# Patient Record
Sex: Female | Born: 1975 | Race: White | Hispanic: No | Marital: Single | Smoking: Current some day smoker
Health system: Southern US, Community
[De-identification: ages and names within clinical notes are randomized; demographics above are authoritative.]

## PROBLEM LIST (undated history)

## (undated) DIAGNOSIS — M519 Unspecified thoracic, thoracolumbar and lumbosacral intervertebral disc disorder: Secondary | ICD-10-CM

---

## 1997-09-17 ENCOUNTER — Emergency Department (HOSPITAL_COMMUNITY): Admission: EM | Admit: 1997-09-17 | Discharge: 1997-09-17 | Payer: Self-pay

## 1998-04-22 ENCOUNTER — Encounter: Payer: Self-pay | Admitting: Emergency Medicine

## 1998-04-22 ENCOUNTER — Emergency Department (HOSPITAL_COMMUNITY): Admission: EM | Admit: 1998-04-22 | Discharge: 1998-04-22 | Payer: Self-pay | Admitting: Emergency Medicine

## 2001-10-20 ENCOUNTER — Ambulatory Visit (HOSPITAL_BASED_OUTPATIENT_CLINIC_OR_DEPARTMENT_OTHER): Admission: RE | Admit: 2001-10-20 | Discharge: 2001-10-20 | Payer: Self-pay | Admitting: Orthopedic Surgery

## 2001-10-22 ENCOUNTER — Emergency Department (HOSPITAL_COMMUNITY): Admission: EM | Admit: 2001-10-22 | Discharge: 2001-10-22 | Payer: Self-pay | Admitting: Emergency Medicine

## 2001-10-22 ENCOUNTER — Encounter: Payer: Self-pay | Admitting: Emergency Medicine

## 2008-03-18 ENCOUNTER — Ambulatory Visit (HOSPITAL_BASED_OUTPATIENT_CLINIC_OR_DEPARTMENT_OTHER): Admission: RE | Admit: 2008-03-18 | Discharge: 2008-03-18 | Payer: Self-pay | Admitting: Family Medicine

## 2008-03-18 ENCOUNTER — Ambulatory Visit: Payer: Self-pay | Admitting: Diagnostic Radiology

## 2008-06-08 ENCOUNTER — Ambulatory Visit: Payer: Self-pay | Admitting: Diagnostic Radiology

## 2008-06-08 ENCOUNTER — Ambulatory Visit (HOSPITAL_BASED_OUTPATIENT_CLINIC_OR_DEPARTMENT_OTHER): Admission: RE | Admit: 2008-06-08 | Discharge: 2008-06-08 | Payer: Self-pay | Admitting: Family Medicine

## 2010-05-25 ENCOUNTER — Emergency Department (HOSPITAL_COMMUNITY): Payer: 59

## 2010-05-25 ENCOUNTER — Emergency Department (HOSPITAL_COMMUNITY)
Admission: EM | Admit: 2010-05-25 | Discharge: 2010-05-26 | Disposition: A | Discharge status: Other Acute Inpatient Hospital | Payer: 59 | Attending: Emergency Medicine | Admitting: Emergency Medicine

## 2010-05-25 DIAGNOSIS — F29 Unspecified psychosis not due to a substance or known physiological condition: Secondary | ICD-10-CM | POA: Insufficient documentation

## 2010-05-25 DIAGNOSIS — T50902A Poisoning by unspecified drugs, medicaments and biological substances, intentional self-harm, initial encounter: Secondary | ICD-10-CM | POA: Insufficient documentation

## 2010-05-25 DIAGNOSIS — T50901A Poisoning by unspecified drugs, medicaments and biological substances, accidental (unintentional), initial encounter: Secondary | ICD-10-CM | POA: Insufficient documentation

## 2010-05-25 DIAGNOSIS — R4182 Altered mental status, unspecified: Secondary | ICD-10-CM | POA: Insufficient documentation

## 2010-05-25 LAB — DIFFERENTIAL
Basophils Relative: 0 % (ref 0–1)
Lymphocytes Relative: 17 % (ref 12–46)
Lymphs Abs: 1.3 10*3/uL (ref 0.7–4.0)
Monocytes Absolute: 0.6 10*3/uL (ref 0.1–1.0)
Monocytes Relative: 8 % (ref 3–12)
Neutro Abs: 5.7 10*3/uL (ref 1.7–7.7)
Neutrophils Relative %: 75 % (ref 43–77)

## 2010-05-25 LAB — URINALYSIS, ROUTINE W REFLEX MICROSCOPIC
Bilirubin Urine: NEGATIVE
Hgb urine dipstick: NEGATIVE
Ketones, ur: NEGATIVE mg/dL
Specific Gravity, Urine: 1.013 (ref 1.005–1.030)
pH: 8 (ref 5.0–8.0)

## 2010-05-25 LAB — COMPREHENSIVE METABOLIC PANEL
ALT: 13 U/L (ref 0–35)
Albumin: 3.9 g/dL (ref 3.5–5.2)
Alkaline Phosphatase: 44 U/L (ref 39–117)
Chloride: 103 mEq/L (ref 96–112)
Glucose, Bld: 104 mg/dL — ABNORMAL HIGH (ref 70–99)
Potassium: 3.6 mEq/L (ref 3.5–5.1)
Sodium: 140 mEq/L (ref 135–145)
Total Bilirubin: 0.3 mg/dL (ref 0.3–1.2)
Total Protein: 6.3 g/dL (ref 6.0–8.3)

## 2010-05-25 LAB — CBC
HCT: 36.9 % (ref 36.0–46.0)
Hemoglobin: 12.2 g/dL (ref 12.0–15.0)
MCH: 28.9 pg (ref 26.0–34.0)
MCHC: 33.1 g/dL (ref 30.0–36.0)
RBC: 4.22 MIL/uL (ref 3.87–5.11)

## 2010-05-25 LAB — ETHANOL: Alcohol, Ethyl (B): <11 mg/dL — ABNORMAL HIGH (ref 0–10)

## 2010-05-25 LAB — RAPID URINE DRUG SCREEN, HOSP PERFORMED
Amphetamines: NOT DETECTED
Cocaine: NOT DETECTED
Opiates: NOT DETECTED
Tetrahydrocannabinol: NOT DETECTED

## 2010-05-25 LAB — POCT PREGNANCY, URINE: Preg Test, Ur: NEGATIVE

## 2010-05-26 ENCOUNTER — Inpatient Hospital Stay (HOSPITAL_COMMUNITY)
Admission: AD | Admit: 2010-05-26 | Discharge: 2010-05-27 | DRG: 885 | Disposition: A | Discharge status: Home / Self Care | Payer: 59 | Attending: Psychiatry | Admitting: Psychiatry

## 2010-05-26 DIAGNOSIS — F4320 Adjustment disorder, unspecified: Secondary | ICD-10-CM

## 2010-05-26 DIAGNOSIS — X838XXA Intentional self-harm by other specified means, initial encounter: Secondary | ICD-10-CM

## 2010-05-26 DIAGNOSIS — T481X4A Poisoning by skeletal muscle relaxants [neuromuscular blocking agents], undetermined, initial encounter: Secondary | ICD-10-CM

## 2010-05-26 DIAGNOSIS — F329 Major depressive disorder, single episode, unspecified: Principal | ICD-10-CM

## 2010-05-26 DIAGNOSIS — F319 Bipolar disorder, unspecified: Secondary | ICD-10-CM

## 2010-05-26 DIAGNOSIS — T424X4A Poisoning by benzodiazepines, undetermined, initial encounter: Secondary | ICD-10-CM

## 2010-05-26 DIAGNOSIS — T43502A Poisoning by unspecified antipsychotics and neuroleptics, intentional self-harm, initial encounter: Secondary | ICD-10-CM

## 2010-05-26 DIAGNOSIS — F0630 Mood disorder due to known physiological condition, unspecified: Secondary | ICD-10-CM

## 2010-05-26 DIAGNOSIS — S61509A Unspecified open wound of unspecified wrist, initial encounter: Secondary | ICD-10-CM

## 2010-05-26 DIAGNOSIS — T50992A Poisoning by other drugs, medicaments and biological substances, intentional self-harm, initial encounter: Secondary | ICD-10-CM

## 2010-05-27 DIAGNOSIS — F329 Major depressive disorder, single episode, unspecified: Secondary | ICD-10-CM

## 2010-05-27 NOTE — Consult Note (Signed)
Shelby Webb, Shelby Webb                ACCOUNT NO.:  [id number]  MEDICAL RECORD NO.:  [id number]           PATIENT TYPE:  E  LOCATION:  WLED                         FACILITY:  [facility]  PHYSICIAN:  Mylinh Cragg T. Sharonda Llamas, M.D.   DATE OF BIRTH:  05/25/1975  DATE OF CONSULTATION: DATE OF DISCHARGE:                                CONSULTATION   HISTORY OF PRESENT ILLNESS:  The patient is a 35 year old Caucasian female who is admitted to the [facility] ED after the patient attempted to kill herself by overdose on 30 Xanax and Flexeril.  The patient told that she had an argument with her partner who actually left her and seeking some other person.  The patient admitted that her relationship is getting worse in past 6 months and they have actually stopped couple therapy but did not new that he is already seeing someone.  The patient feels that she was cheated and started getting more depressed and having suicidal thoughts.  She actually cut her both arms with razorblade superficially and took the overdose on 30 Xanax.  The patient also admitted using cocaine on occasion but on a regular basis.  The patient denies any homicidal thoughts, hallucinations or paranoia.  PAST PSYCHIATRIC HISTORY:  The patient has at least 2 psychiatric admissions in the past, one in [facility] when she was diagnosed with bipolar and then [facility] for overnight.  The patient has been taken in the past Wellbutrin but did not like.  She has also seen one time by Dr. Lafayette Dragon  who diagnosed her bipolar and given Depakote but she did not took it.  She has been getting Effexor and Xanax from her primary care doctor, Dr. Tyrell Antonio.  She is also scheduled to see a therapist in [place] which has not begun.  PSYCHOSOCIAL HISTORY:  The patient lives with her 3 cats and 3 dogs. The patient has limited contact with her parents though she does talk to her mother  once a month on the phone.  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  ALCOHOL AND SUBSTANCE ABUSE HISTORY:  The patient admitted history of using cocaine on and off and beer on and off but she claimed that she is adult and she can drink alcohol.  MEDICAL HISTORY:  The patient has bulging disk.  She takes Flexeril 10 mg prescribed by Dr. Tyrell Antonio.  MENTAL STATUS EXAMINATION:  The patient is mildly obese female who is in hospital PJs.  She is irritable and maintained poor eye contact.  Her speech is at times fast and rapid.  Her thought process is also at times circumstantial.  There are superficial marks on both arms which could be due to cutting from razorblade.  She continued to endorse suicidal thinking "I just wanted to be dead" but she denies any homicidal thoughts, paranoia or any hallucinations.  Her attention and concentration were distracted at times.  There was no psychosis present. She is alert and oriented x3.  Her insight, judgment, impulse control were poor.  DIAGNOSIS:  AXIS I:  Mood disorder, not otherwise specified; rule  out bipolar disorder, depressed type; rule out major depressive disorder, severe. AXIS II:  Deferred. AXIS III:  See medical history. AXIS IV:  Moderate. AXIS V:  30  PLAN:  I talked with the patient about inpatient stabilization.  The patient at this time agreed to come for stabilization in [facility].  The patient does need stabilization and inpatient treatment. Once medically cleared, the patient can be transferred to [facility].     Thelmer Legler T. Lolly Mustache, M.D.     STA/MEDQ  D:  05/26/2010  T:  05/26/2010  Job:  [id number]  Electronically Signed by Kathryne Sharper M.D. on 05/27/2010 10:41:23 PM

## 2010-06-01 NOTE — Op Note (Signed)
Shelby Webb, Shelby Webb                          ACCOUNT NO.:  [id number]   MEDICAL RECORD NO.:  [id number]                   PATIENT TYPE:  AMB   LOCATION:  DSC                                  FACILITY:  [facility]   PHYSICIAN:  Katy Fitch. Naaman Plummer., M.D.          DATE OF BIRTH:  [birth year]   DATE OF PROCEDURE:  10/20/2001  DATE OF DISCHARGE:                                 OPERATIVE REPORT   PREOPERATIVE DIAGNOSIS:  Painful mass, dorsal aspect of left wrist  consistent with dorsal subretinacular ganglion.   POSTOPERATIVE DIAGNOSIS:  Painful mass, dorsal aspect of left wrist  consistent with dorsal subretinacular ganglion.   OPERATION:  Excision of left dorsal ganglion.   SURGEON:  Katy Fitch. Sypher, M.D.   ASSISTANT:  Jonni Sanger, P.A.   ANESTHESIA:  IV regional   SUPERVISING ANESTHESIOLOGIST:  Charles E. Gelene Mink, M.D.   INDICATIONS FOR PROCEDURE:  The patient is a 35 year old woman who presented  for evaluation and management of a painful mass on the dorsal aspect of her  left wrist.  Clinical examination reveals signs of a subretinacular dorsal  ganglion between the third and fourth dorsal compartment.  Due to the  failure to response to nonoperative measures, she is brought to the  operating room at this time for excision of her dorsal ganglion mass.   DESCRIPTION OF PROCEDURE:  The patient is brought to the operating room and  placed in the supine position on the operating room table.  Following  induction of general anesthesia, the left arm was prepped with Betadine soap  and solution, sterilely draped.  Following exsanguination of the left arm  with an Esmarch bandage, an arterial tourniquet on the proximal brachium was  inflated to 210 mmHg.  Procedure commenced with a transverse incision  directly over the mass.  Subcutaneous tissues were carefully divided  revealing the extensor retinaculum.  This was split in the line of its  fibers and retracted proximally  and distally.  The tendons of the second,  third and fourth dorsal compartments were identified and the mass palpated.  The capsule was spread over the mass and a 1 cm diameter ganglion type  lesion was identified directly over the scaphoid lunate ligament.  This was  carefully excised from the dorsal aspect of the scaphoid lunate ligament.  The radiocarpal ligament joint was inspected and found to be without signs  of other pathology.  Bleeding points were electrocauterized bipolar current,  followed by repair of the capsule anatomically with figure-of-eight sutures  of 4-0 Vicryl followed by repair of the retinaculum with 4-0 Vicryl and  repair of the skin with intradermal 3-0 Prolene.  There were no apparent  complications.   Compressive dressing was applied with volar plaster splint in 5 degrees of  dorsiflexion.  Katy Fitch Naaman Plummer., M.D.     RVS/MEDQ  D:  10/20/2001  T:  10/20/2001  Job:  [id number]
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
## 2012-11-22 ENCOUNTER — Emergency Department (HOSPITAL_BASED_OUTPATIENT_CLINIC_OR_DEPARTMENT_OTHER): Payer: 59

## 2012-11-22 ENCOUNTER — Encounter (HOSPITAL_BASED_OUTPATIENT_CLINIC_OR_DEPARTMENT_OTHER): Payer: Self-pay | Admitting: Emergency Medicine

## 2012-11-22 ENCOUNTER — Emergency Department (HOSPITAL_BASED_OUTPATIENT_CLINIC_OR_DEPARTMENT_OTHER)
Admission: EM | Admit: 2012-11-22 | Discharge: 2012-11-22 | Disposition: A | Discharge status: Home / Self Care | Payer: 59 | Attending: Emergency Medicine | Admitting: Emergency Medicine

## 2012-11-22 DIAGNOSIS — Y9389 Activity, other specified: Secondary | ICD-10-CM | POA: Insufficient documentation

## 2012-11-22 DIAGNOSIS — F17200 Nicotine dependence, unspecified, uncomplicated: Secondary | ICD-10-CM | POA: Insufficient documentation

## 2012-11-22 DIAGNOSIS — IMO0002 Reserved for concepts with insufficient information to code with codable children: Secondary | ICD-10-CM | POA: Diagnosis not present

## 2012-11-22 DIAGNOSIS — S0990XA Unspecified injury of head, initial encounter: Secondary | ICD-10-CM | POA: Diagnosis not present

## 2012-11-22 DIAGNOSIS — Y92410 Unspecified street and highway as the place of occurrence of the external cause: Secondary | ICD-10-CM | POA: Diagnosis not present

## 2012-11-22 DIAGNOSIS — R209 Unspecified disturbances of skin sensation: Secondary | ICD-10-CM | POA: Diagnosis not present

## 2012-11-22 DIAGNOSIS — Z8739 Personal history of other diseases of the musculoskeletal system and connective tissue: Secondary | ICD-10-CM | POA: Diagnosis not present

## 2012-11-22 DIAGNOSIS — S161XXA Strain of muscle, fascia and tendon at neck level, initial encounter: Secondary | ICD-10-CM

## 2012-11-22 DIAGNOSIS — H9319 Tinnitus, unspecified ear: Secondary | ICD-10-CM | POA: Insufficient documentation

## 2012-11-22 DIAGNOSIS — R42 Dizziness and giddiness: Secondary | ICD-10-CM | POA: Insufficient documentation

## 2012-11-22 DIAGNOSIS — S139XXA Sprain of joints and ligaments of unspecified parts of neck, initial encounter: Secondary | ICD-10-CM | POA: Insufficient documentation

## 2012-11-22 DIAGNOSIS — S0993XA Unspecified injury of face, initial encounter: Secondary | ICD-10-CM | POA: Diagnosis present

## 2012-11-22 HISTORY — DX: Unspecified thoracic, thoracolumbar and lumbosacral intervertebral disc disorder: M51.9

## 2012-11-22 MED ORDER — CYCLOBENZAPRINE HCL 10 MG PO TABS: 10.0000 mg | ORAL_TABLET | Freq: Two times a day (BID) | ORAL | Status: AC | PRN

## 2012-11-22 MED ORDER — NAPROXEN 500 MG PO TABS: 500.0000 mg | ORAL_TABLET | Freq: Two times a day (BID) | ORAL | Status: AC

## 2012-11-22 NOTE — ED Notes (Signed)
MD at bedside. 
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
## 2019-06-11 ENCOUNTER — Other Ambulatory Visit: Payer: Self-pay | Admitting: Family Medicine

## 2019-06-11 DIAGNOSIS — Z1231 Encounter for screening mammogram for malignant neoplasm of breast: Secondary | ICD-10-CM

## 2019-08-03 ENCOUNTER — Ambulatory Visit: Payer: Self-pay

## 2019-08-24 ENCOUNTER — Emergency Department (HOSPITAL_COMMUNITY)
Admission: EM | Admit: 2019-08-24 | Discharge: 2019-08-25 | Disposition: A | Discharge status: Home / Self Care | Payer: BLUE CROSS/BLUE SHIELD | Attending: Emergency Medicine | Admitting: Emergency Medicine

## 2019-08-24 ENCOUNTER — Encounter (HOSPITAL_COMMUNITY): Payer: Self-pay | Admitting: Pharmacy Technician

## 2019-08-24 ENCOUNTER — Other Ambulatory Visit: Payer: Self-pay

## 2019-08-24 ENCOUNTER — Emergency Department (HOSPITAL_COMMUNITY): Payer: BLUE CROSS/BLUE SHIELD

## 2019-08-24 DIAGNOSIS — H532 Diplopia: Secondary | ICD-10-CM | POA: Diagnosis not present

## 2019-08-24 DIAGNOSIS — Z5321 Procedure and treatment not carried out due to patient leaving prior to being seen by health care provider: Secondary | ICD-10-CM | POA: Diagnosis not present

## 2019-08-24 DIAGNOSIS — R202 Paresthesia of skin: Secondary | ICD-10-CM | POA: Diagnosis not present

## 2019-08-24 LAB — I-STAT CHEM 8, ED
BUN: 12 mg/dL (ref 6–20)
Calcium, Ion: 1.16 mmol/L (ref 1.15–1.40)
Chloride: 102 mmol/L (ref 98–111)
Creatinine, Ser: 1 mg/dL (ref 0.44–1.00)
Glucose, Bld: 144 mg/dL — ABNORMAL HIGH (ref 70–99)
HCT: 43 % (ref 36.0–46.0)
Hemoglobin: 14.6 g/dL (ref 12.0–15.0)
Potassium: 4.1 mmol/L (ref 3.5–5.1)
Sodium: 139 mmol/L (ref 135–145)
TCO2: 24 mmol/L (ref 22–32)

## 2019-08-24 LAB — COMPREHENSIVE METABOLIC PANEL
ALT: 21 U/L (ref 0–44)
AST: 24 U/L (ref 15–41)
Albumin: 4.9 g/dL (ref 3.5–5.0)
Alkaline Phosphatase: 65 U/L (ref 38–126)
Anion gap: 14 (ref 5–15)
BUN: 10 mg/dL (ref 6–20)
CO2: 22 mmol/L (ref 22–32)
Calcium: 9.8 mg/dL (ref 8.9–10.3)
Chloride: 101 mmol/L (ref 98–111)
Creatinine, Ser: 1.04 mg/dL — ABNORMAL HIGH (ref 0.44–1.00)
GFR calc Af Amer: >60 mL/min (ref >60)
GFR calc non Af Amer: >60 mL/min (ref >60)
Glucose, Bld: 141 mg/dL — ABNORMAL HIGH (ref 70–99)
Potassium: 4 mmol/L (ref 3.5–5.1)
Sodium: 137 mmol/L (ref 135–145)
Total Bilirubin: 0.3 mg/dL (ref 0.3–1.2)
Total Protein: 8 g/dL (ref 6.5–8.1)

## 2019-08-24 LAB — DIFFERENTIAL
Abs Immature Granulocytes: 0.03 10*3/uL (ref 0.00–0.07)
Basophils Absolute: 0.1 10*3/uL (ref 0.0–0.1)
Basophils Relative: 1 %
Eosinophils Absolute: 0.2 10*3/uL (ref 0.0–0.5)
Eosinophils Relative: 2 %
Immature Granulocytes: 0 %
Lymphocytes Relative: 29 %
Lymphs Abs: 2.2 10*3/uL (ref 0.7–4.0)
Monocytes Absolute: 0.6 10*3/uL (ref 0.1–1.0)
Monocytes Relative: 8 %
Neutro Abs: 4.6 10*3/uL (ref 1.7–7.7)
Neutrophils Relative %: 60 %

## 2019-08-24 LAB — CBC
HCT: 43.5 % (ref 36.0–46.0)
Hemoglobin: 13.9 g/dL (ref 12.0–15.0)
MCH: 29.4 pg (ref 26.0–34.0)
MCHC: 32 g/dL (ref 30.0–36.0)
MCV: 92.2 fL (ref 80.0–100.0)
Platelets: 364 10*3/uL (ref 150–400)
RBC: 4.72 MIL/uL (ref 3.87–5.11)
RDW: 12.6 % (ref 11.5–15.5)
WBC: 7.7 10*3/uL (ref 4.0–10.5)
nRBC: 0 % (ref 0.0–0.2)

## 2019-08-24 LAB — APTT: aPTT: 29 seconds (ref 24–36)

## 2019-08-24 LAB — PROTIME-INR
INR: 1 (ref 0.8–1.2)
Prothrombin Time: 12.3 seconds (ref 11.4–15.2)

## 2019-08-24 LAB — I-STAT BETA HCG BLOOD, ED (MC, WL, AP ONLY): I-stat hCG, quantitative: <5 m[IU]/mL (ref <5)

## 2019-08-24 MED ORDER — SODIUM CHLORIDE 0.9% FLUSH: 3.0000 mL | Freq: Once | INTRAVENOUS | Status: DC

## 2019-08-24 NOTE — ED Triage Notes (Signed)
Pt here with reports of tingling to hands and feet, double vision and trouble with balance for the last several days. Went to PCP and sent here for stroke eval.

## 2019-08-25 NOTE — ED Notes (Signed)
Pt decided to leave AMA. Pt was advised to stay to see the doctor. She stated that she was going to call medical records in the morning to check on the result of her labs.

## 2020-07-12 ENCOUNTER — Other Ambulatory Visit: Payer: Self-pay | Admitting: Family Medicine

## 2022-01-18 IMAGING — CT CT HEAD W/O CM
4 series · 16 of 47 positions shown, 18 images · non-contrast
Comparison: 11/22/2012

CLINICAL DATA: Paresthesias, neuro deficit

EXAM:
CT HEAD WITHOUT CONTRAST
TECHNIQUE: Contiguous axial images were obtained from the base of the skull
through the vertex without intravenous contrast.

[Series 3: head without · axial · non-contrast · 0.43mm/px · z∈[-71,+39]mm · 7 of 30 slices shown, 9 images]
[im 4/30  brain]
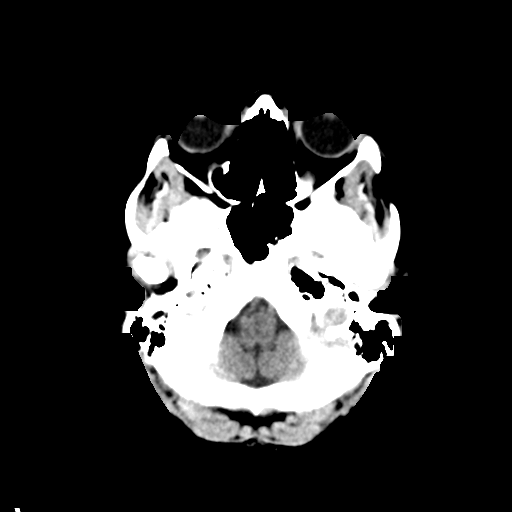
[im 4/30  bone]
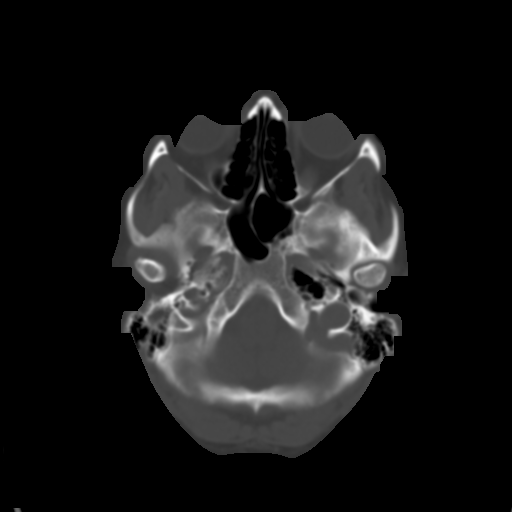
[im 8/30  brain]
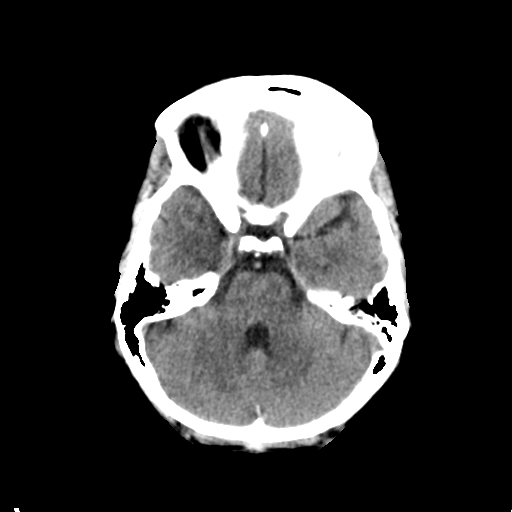
[im 11/30  brain]
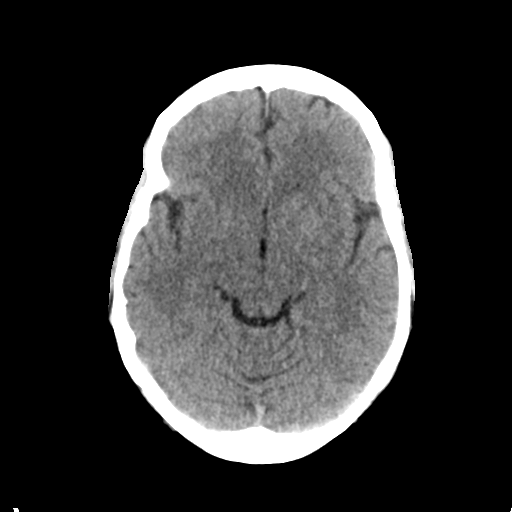
[im 15/30  brain]
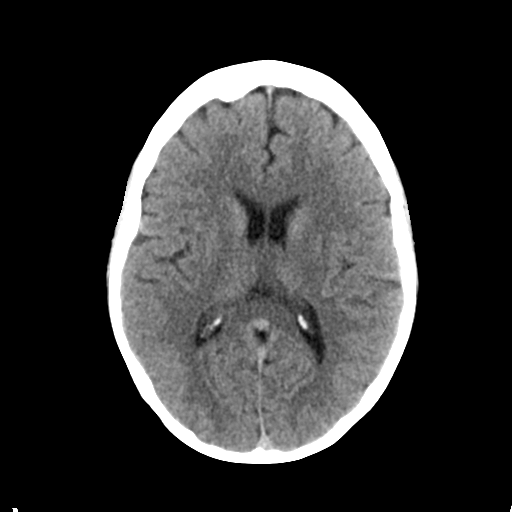
[im 19/30  brain]
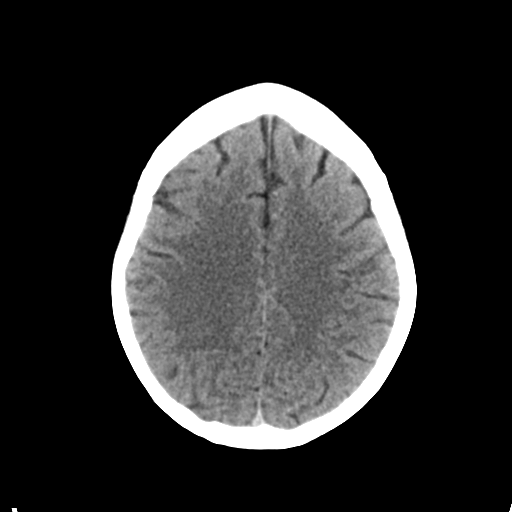
[im 19/30  bone]
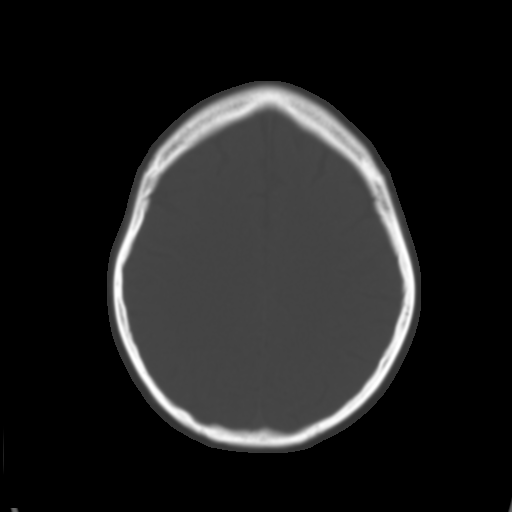
[im 22/30  brain]
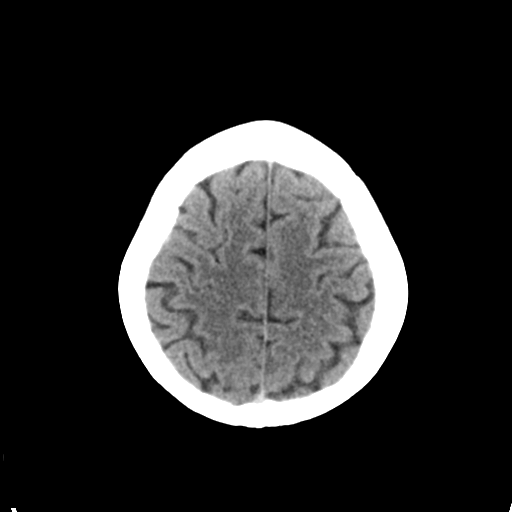
[im 26/30  brain]
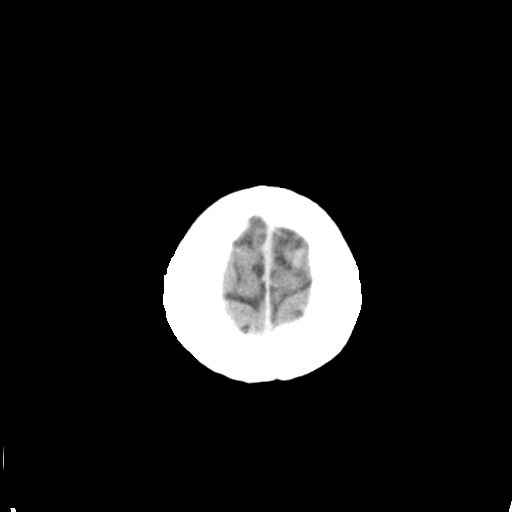

[Series 4: head bone · axial · 0.43mm/px · z∈[-72,-44]mm · 3 of 74 slices shown]
[im 8/74  bone]
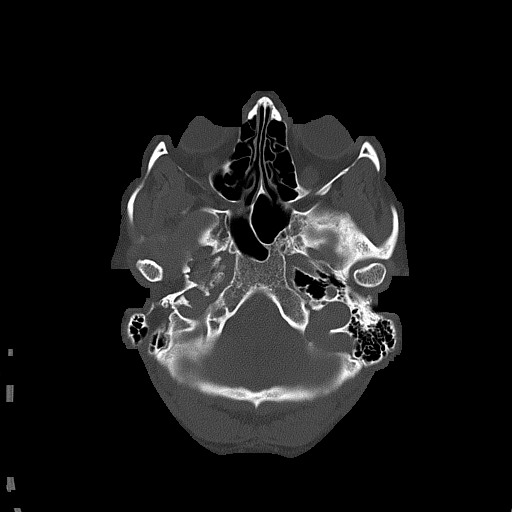
[im 15/74  bone]
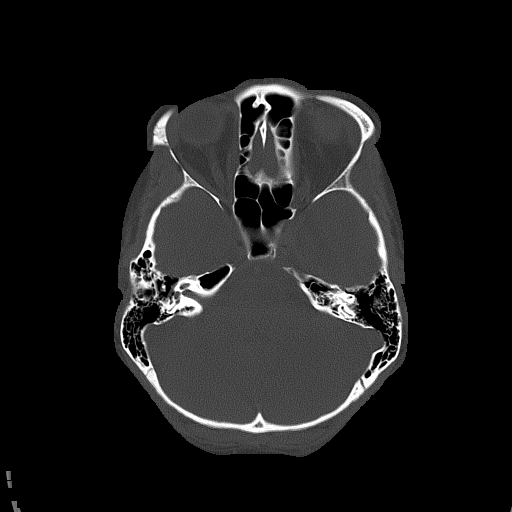
[im 22/74  bone]
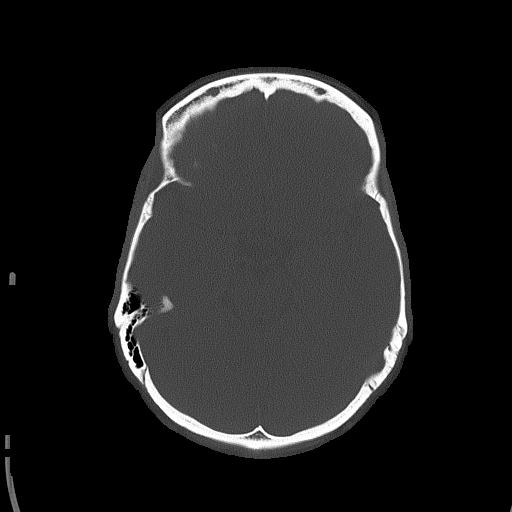

[Series 5: head without cor · coronal · non-contrast · 0.33mm/px · 3 of 67 slices shown]
[im 23/67  brain]
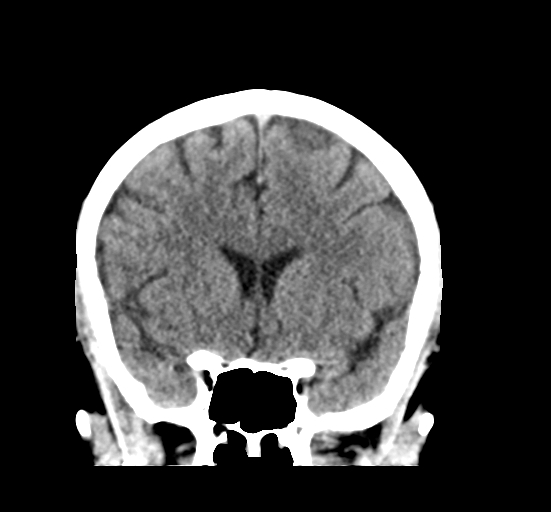
[im 30/67  brain]
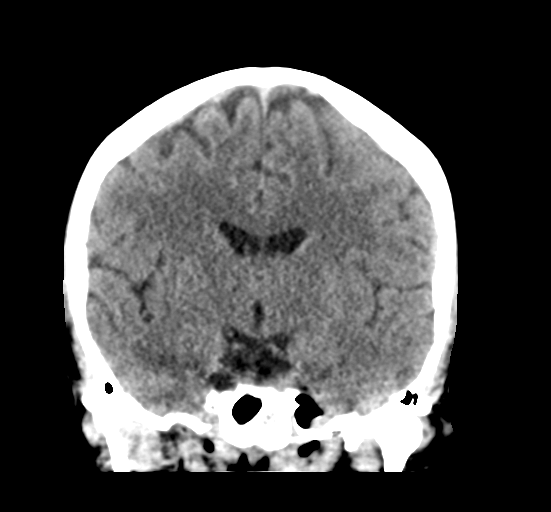
[im 37/67  brain]
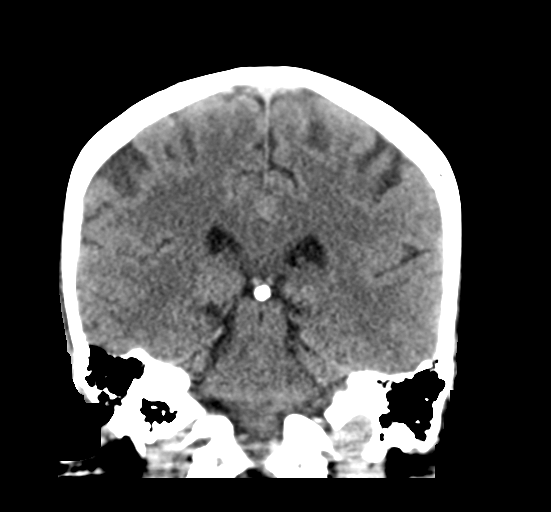

[Series 6: head without sag · sagittal · non-contrast · 0.32mm/px · 3 of 57 slices shown]
[im 19/57  brain]
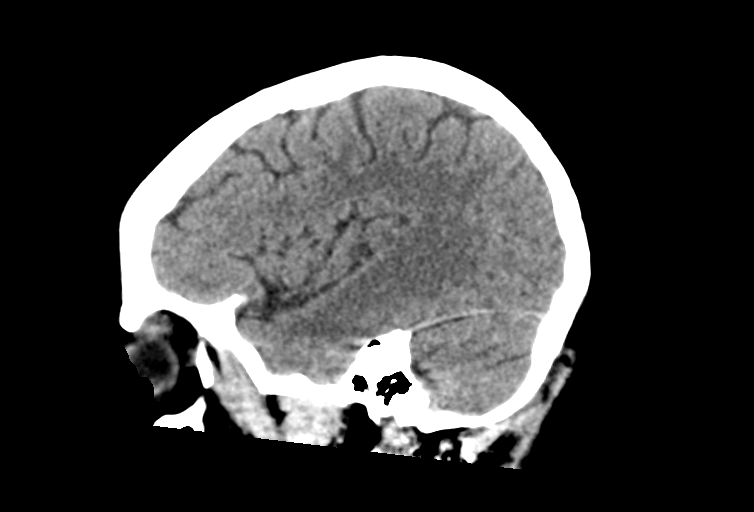
[im 29/57  brain]
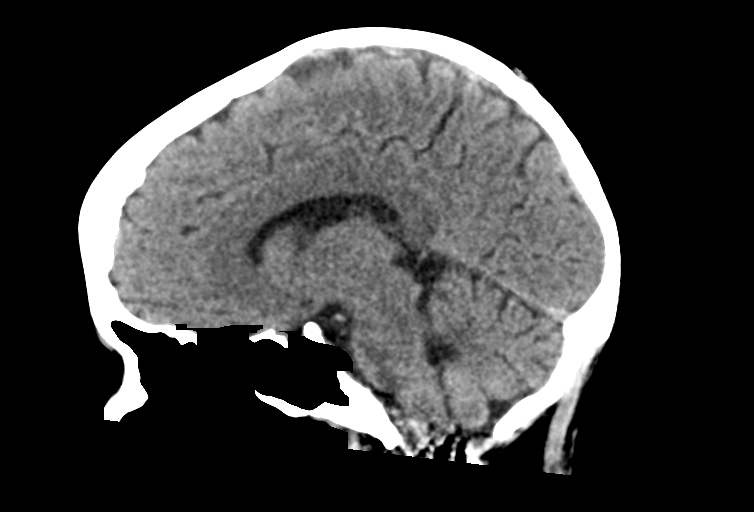
[im 38/57  brain]
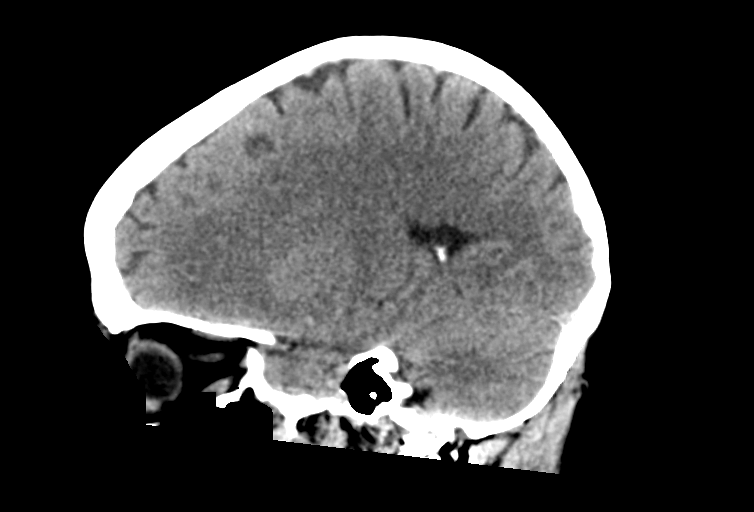

[16 of 47 positions shown; findings below may reference images not displayed]

FINDINGS: Brain: No acute intracranial abnormality. Specifically, no
hemorrhage, hydrocephalus, mass lesion, acute infarction, or
significant intracranial injury.

Vascular: No hyperdense vessel or unexpected calcification.

Skull: No acute calvarial abnormality.

Sinuses/Orbits: Visualized paranasal sinuses and mastoids clear.
Orbital soft tissues unremarkable.

Other: None
IMPRESSION: Normal study.

## 2022-03-30 ENCOUNTER — Other Ambulatory Visit: Payer: Self-pay

## 2022-03-30 ENCOUNTER — Emergency Department (HOSPITAL_BASED_OUTPATIENT_CLINIC_OR_DEPARTMENT_OTHER)
Admission: EM | Admit: 2022-03-30 | Discharge: 2022-03-31 | Disposition: A | Discharge status: Home / Self Care | Payer: BLUE CROSS/BLUE SHIELD | Attending: Emergency Medicine | Admitting: Emergency Medicine

## 2022-03-30 ENCOUNTER — Emergency Department (HOSPITAL_BASED_OUTPATIENT_CLINIC_OR_DEPARTMENT_OTHER): Payer: BLUE CROSS/BLUE SHIELD

## 2022-03-30 ENCOUNTER — Encounter (HOSPITAL_BASED_OUTPATIENT_CLINIC_OR_DEPARTMENT_OTHER): Payer: Self-pay

## 2022-03-30 DIAGNOSIS — R55 Syncope and collapse: Secondary | ICD-10-CM | POA: Insufficient documentation

## 2022-03-30 DIAGNOSIS — F1410 Cocaine abuse, uncomplicated: Secondary | ICD-10-CM | POA: Diagnosis not present

## 2022-03-30 LAB — CBC WITH DIFFERENTIAL/PLATELET
Abs Immature Granulocytes: 0.02 10*3/uL (ref 0.00–0.07)
Basophils Absolute: 0 10*3/uL (ref 0.0–0.1)
Basophils Relative: 0 %
Eosinophils Absolute: 0.1 10*3/uL (ref 0.0–0.5)
Eosinophils Relative: 1 %
HCT: 39.2 % (ref 36.0–46.0)
Hemoglobin: 13.6 g/dL (ref 12.0–15.0)
Immature Granulocytes: 0 %
Lymphocytes Relative: 12 %
Lymphs Abs: 1.3 10*3/uL (ref 0.7–4.0)
MCH: 30 pg (ref 26.0–34.0)
MCHC: 34.7 g/dL (ref 30.0–36.0)
MCV: 86.5 fL (ref 80.0–100.0)
Monocytes Absolute: 0.4 10*3/uL (ref 0.1–1.0)
Monocytes Relative: 4 %
Neutro Abs: 9.3 10*3/uL — ABNORMAL HIGH (ref 1.7–7.7)
Neutrophils Relative %: 83 %
Platelets: 355 10*3/uL (ref 150–400)
RBC: 4.53 MIL/uL (ref 3.87–5.11)
RDW: 12.2 % (ref 11.5–15.5)
WBC: 11.2 10*3/uL — ABNORMAL HIGH (ref 4.0–10.5)
nRBC: 0 % (ref 0.0–0.2)

## 2022-03-30 LAB — BASIC METABOLIC PANEL
Anion gap: 8 (ref 5–15)
BUN: 10 mg/dL (ref 6–20)
CO2: 29 mmol/L (ref 22–32)
Calcium: 9.8 mg/dL (ref 8.9–10.3)
Chloride: 99 mmol/L (ref 98–111)
Creatinine, Ser: 0.82 mg/dL (ref 0.44–1.00)
GFR, Estimated: >60 mL/min (ref >60)
Glucose, Bld: 109 mg/dL — ABNORMAL HIGH (ref 70–99)
Potassium: 4 mmol/L (ref 3.5–5.1)
Sodium: 136 mmol/L (ref 135–145)

## 2022-03-30 LAB — HEPATIC FUNCTION PANEL
ALT: 15 U/L (ref 0–44)
AST: 14 U/L — ABNORMAL LOW (ref 15–41)
Albumin: 4.5 g/dL (ref 3.5–5.0)
Alkaline Phosphatase: 50 U/L (ref 38–126)
Bilirubin, Direct: <0.1 mg/dL (ref 0.0–0.2)
Total Bilirubin: 0.3 mg/dL (ref 0.3–1.2)
Total Protein: 7.6 g/dL (ref 6.5–8.1)

## 2022-03-30 LAB — CBG MONITORING, ED: Glucose-Capillary: 109 mg/dL — ABNORMAL HIGH (ref 70–99)

## 2022-03-30 LAB — ETHANOL: Alcohol, Ethyl (B): <10 mg/dL (ref <10)

## 2022-03-30 MED ORDER — SODIUM CHLORIDE 0.9 % IV BOLUS
1000.0000 mL | Freq: Once | INTRAVENOUS | Status: AC
  Administered 2022-03-30: 1000 mL via INTRAVENOUS

## 2022-03-30 NOTE — ED Provider Notes (Signed)
Page  Provider Note  CSN: [id number] Arrival date & time: 03/30/22 2202  History Chief Complaint  Patient presents with   Loss of Consciousness    Shelby Webb is a 47 y.o. female brought by a friend for evaluation of syncopal episode. Patient has a history of a 11mm paraophthalamic ICA aneurysm. She reports earlier today, around 1500hrs, she used cocaine and molly. About 3 hours later she was sitting on the bed when she began to feel lightheaded, like her blood sugar was dropping. Friend saw her draw her arms up, become unresponsive and slump over sideways. She did not fall off the bed. She was unconscious for about 30 seconds or less and quickly returned to baseline. She is feeling better now, went to UC earlier this evening and subsequently sent to the ED for further evaluation. She denies any recent illness, no fever, chills, cough, congestion, CP, racing heart, N/V/D or dysuria. She reports chronic issues with sleep, typically only sleeping for 4 hours per night but this is baseline for her. She denies any opiate use.    Home Medications Prior to Admission medications   Medication Sig Start Date End Date Taking? Authorizing Provider  cyclobenzaprine (FLEXERIL) 10 MG tablet Take 1 tablet (10 mg total) by mouth 2 (two) times daily as needed for muscle spasms. 11/22/12   Fredia Sorrow, MD  naproxen (NAPROSYN) 500 MG tablet Take 1 tablet (500 mg total) by mouth 2 (two) times daily. 11/22/12   Fredia Sorrow, MD     Allergies    Codeine, Norco [hydrocodone-acetaminophen], and Oxycodone   Review of Systems   Review of Systems Please see HPI for pertinent positives and negatives  Physical Exam BP 138/89   Pulse 85   Temp 98 F (36.7 C)   Resp 17   Ht 5\' 2"  (1.575 m)   Wt 72.6 kg   LMP 03/11/2022 (Approximate)   SpO2 96%   BMI 29.26 kg/m   Physical Exam Vitals and nursing note reviewed.  Constitutional:       Appearance: Normal appearance.  HENT:     Head: Normocephalic and atraumatic.     Nose: Nose normal.     Mouth/Throat:     Mouth: Mucous membranes are moist.  Eyes:     Extraocular Movements: Extraocular movements intact.     Conjunctiva/sclera: Conjunctivae normal.  Cardiovascular:     Rate and Rhythm: Normal rate.  Pulmonary:     Effort: Pulmonary effort is normal.     Breath sounds: Normal breath sounds.  Abdominal:     General: Abdomen is flat.     Palpations: Abdomen is soft.     Tenderness: There is no abdominal tenderness.  Musculoskeletal:        General: No swelling. Normal range of motion.     Cervical back: Neck supple.  Skin:    General: Skin is warm and dry.  Neurological:     General: No focal deficit present.     Mental Status: She is alert.  Psychiatric:        Mood and Affect: Mood normal.     ED Results / Procedures / Treatments   EKG EKG Interpretation  Date/Time:  Saturday March 30 2022 22:37:08 EDT Ventricular Rate:  76 PR Interval:  179 QRS Duration: 86 QT Interval:  387 QTC Calculation: 436 R Axis:   56 Text Interpretation: Duplicate Confirmed by Calvert Cantor [phone]) on 03/30/2022 10:57:29 PM  Procedures Procedures  Medications Ordered in the ED Medications  sodium chloride 0.9 % bolus 1,000 mL (1,000 mLs Intravenous New Bag/Given 03/30/22 2243)    Initial Impression and Plan  Patient here several hours after syncopal episode while resting in bed. She did not have any trauma. No reported seizure-like activity. She had been using cocaine and molly a few hours before this, but no opiates. She is feeling fine now. Has a history of a cerebral aneurysm, most recently imaged at [place] with MRI on 11/11/2020. Labs and imaging ordered prior to my arrival are in process. EKG is unremarkable.   ED Course   Clinical Course as of 03/31/22 0015  Sat Mar 30, 2022  2341 CBC with mild leukocytosis. BMP, LFTs, EtOH are neg.  [CS]  2359 I personally  viewed the images from radiology studies and agree with radiologist interpretation: CT head is normal [CS]  Sun Mar 31, 2022  0012 Patient has not collected a urine, but does not want to stay for UDS. She is confident there was no adulterated drugs that might unexpectedly show up. She would like to go home. No clear source of her syncope, but does not appear to be an acute or life threatening process at this point. Plan discharge, advised to try to get more sleep. Avoid drugs/alcohol and follow up with her PCP. RTED for any other concerns.  [CS]    Clinical Course User Index [CS] Truddie Hidden, MD     MDM Rules/Calculators/A&P Medical Decision Making Given presenting complaint, I considered that admission might be necessary. After review of results from ED lab and/or imaging studies, admission to the hospital is not indicated at this time.    Problems Addressed: Cocaine abuse [facility]): chronic illness or injury Syncope, unspecified syncope type: acute illness or injury  Amount and/or Complexity of Data Reviewed Labs: ordered. Decision-making details documented in ED Course. Radiology: ordered and independent interpretation performed. Decision-making details documented in ED Course. ECG/medicine tests: ordered and independent interpretation performed. Decision-making details documented in ED Course.  Risk Decision regarding hospitalization.     Final Clinical Impression(s) / ED Diagnoses Final diagnoses:  Syncope, unspecified syncope type  Cocaine abuse [facility])    Rx / DC Orders ED Discharge Orders     None        Truddie Hidden, MD 03/31/22 [phone]

## 2022-03-30 NOTE — ED Triage Notes (Signed)
POV from home, A&O x 4, GCS 15, amb to room  Approx 2 hours ago pt was sitting on bed, started to feel lightheaded, "like my blood sugar was dropping", friend that was witnessed incident that she went unconscious for approx 30 seconds. Fell onto bed and denies injury.
